# Patient Record
Sex: Female | Born: 1970 | Race: White | Hispanic: No | Marital: Married | Smoking: Never smoker
Health system: Southern US, Community
[De-identification: ages and names within clinical notes are randomized; demographics above are authoritative.]

## PROBLEM LIST (undated history)

## (undated) DIAGNOSIS — K219 Gastro-esophageal reflux disease without esophagitis: Secondary | ICD-10-CM

## (undated) HISTORY — PX: TUBAL LIGATION: SHX77

## (undated) HISTORY — PX: WISDOM TOOTH EXTRACTION: SHX21

## (undated) HISTORY — PX: OTHER SURGICAL HISTORY: SHX169

## (undated) HISTORY — PX: HERNIA REPAIR: SHX51

---

## 2004-10-12 ENCOUNTER — Ambulatory Visit: Payer: Self-pay | Admitting: General Surgery

## 2006-03-22 ENCOUNTER — Ambulatory Visit: Payer: Self-pay

## 2009-05-16 ENCOUNTER — Ambulatory Visit: Payer: Self-pay | Admitting: Family Medicine

## 2018-01-15 ENCOUNTER — Emergency Department: Payer: 59

## 2018-01-15 ENCOUNTER — Emergency Department
Admission: EM | Admit: 2018-01-15 | Discharge: 2018-01-15 | Disposition: A | Discharge status: Home / Self Care | Payer: 59 | Attending: Emergency Medicine | Admitting: Emergency Medicine

## 2018-01-15 ENCOUNTER — Encounter: Payer: Self-pay | Admitting: Emergency Medicine

## 2018-01-15 DIAGNOSIS — R55 Syncope and collapse: Secondary | ICD-10-CM | POA: Diagnosis present

## 2018-01-15 DIAGNOSIS — J111 Influenza due to unidentified influenza virus with other respiratory manifestations: Secondary | ICD-10-CM

## 2018-01-15 DIAGNOSIS — J101 Influenza due to other identified influenza virus with other respiratory manifestations: Secondary | ICD-10-CM | POA: Diagnosis not present

## 2018-01-15 LAB — BASIC METABOLIC PANEL
Anion gap: 8 (ref 5–15)
BUN: 10 mg/dL (ref 6–20)
CALCIUM: 8.5 mg/dL — AB (ref 8.9–10.3)
CO2: 23 mmol/L (ref 22–32)
CREATININE: 0.87 mg/dL (ref 0.44–1.00)
Chloride: 108 mmol/L (ref 98–111)
GFR calc Af Amer: >60 mL/min (ref >60)
GLUCOSE: 115 mg/dL — AB (ref 70–99)
Potassium: 3.5 mmol/L (ref 3.5–5.1)
Sodium: 139 mmol/L (ref 135–145)

## 2018-01-15 LAB — TROPONIN I
Troponin I: <0.03 ng/mL (ref <0.03)
Troponin I: <0.03 ng/mL

## 2018-01-15 LAB — CBC
HEMATOCRIT: 37.2 % (ref 36.0–46.0)
Hemoglobin: 11.9 g/dL — ABNORMAL LOW (ref 12.0–15.0)
MCH: 28.6 pg (ref 26.0–34.0)
MCHC: 32 g/dL (ref 30.0–36.0)
MCV: 89.4 fL (ref 80.0–100.0)
NRBC: 0 % (ref 0.0–0.2)
PLATELETS: 235 10*3/uL (ref 150–400)
RBC: 4.16 MIL/uL (ref 3.87–5.11)
RDW: 13.4 % (ref 11.5–15.5)
WBC: 4.7 10*3/uL (ref 4.0–10.5)

## 2018-01-15 LAB — INFLUENZA PANEL BY PCR (TYPE A & B)
Influenza A By PCR: POSITIVE — AB
Influenza B By PCR: NEGATIVE

## 2018-01-15 LAB — FIBRIN DERIVATIVES D-DIMER (ARMC ONLY): Fibrin derivatives D-dimer (ARMC): 1866.25 ng{FEU}/mL — ABNORMAL HIGH (ref 0.00–499.00)

## 2018-01-15 MED ORDER — ONDANSETRON 4 MG PO TBDP: 4.0000 mg | ORAL_TABLET | Freq: Three times a day (TID) | ORAL | 0 refills | Status: AC | PRN

## 2018-01-15 MED ORDER — KETOROLAC TROMETHAMINE 30 MG/ML IJ SOLN
30.0000 mg | Freq: Once | INTRAMUSCULAR | Status: AC
  Administered 2018-01-15: 30 mg via INTRAVENOUS
  Filled 2018-01-15: qty 1

## 2018-01-15 MED ORDER — GUAIFENESIN-CODEINE 100-10 MG/5ML PO SOLN: 5.0000 mL | Freq: Four times a day (QID) | ORAL | 0 refills | Status: AC | PRN

## 2018-01-15 MED ORDER — SODIUM CHLORIDE 0.9 % IV BOLUS
1000.0000 mL | Freq: Once | INTRAVENOUS | Status: AC
  Administered 2018-01-15: 1000 mL via INTRAVENOUS

## 2018-01-15 MED ORDER — IOHEXOL 350 MG/ML SOLN
75.0000 mL | Freq: Once | INTRAVENOUS | Status: AC | PRN
  Administered 2018-01-15: 75 mL via INTRAVENOUS

## 2018-01-15 MED ORDER — OSELTAMIVIR PHOSPHATE 75 MG PO CAPS: 75.0000 mg | ORAL_CAPSULE | Freq: Two times a day (BID) | ORAL | 0 refills | Status: AC

## 2018-01-15 NOTE — Discharge Instructions (Signed)
You have been seen in the emergency department after a syncopal episode, and have been diagnosed with influenza A.  Please take your Tamiflu as prescribed for the next 5 days.  Please use Tylenol or ibuprofen at home for fever or discomfort, drink plenty of fluids.  Use your cough medication and nausea medication as needed but only as prescribed.  As we discussed your EKG is somewhat atypical, please follow-up with your primary care doctor in the next several days for recheck/reevaluation.  If you develop chest pain or trouble breathing please return to the emergency department for further evaluation.

## 2018-01-15 NOTE — ED Provider Notes (Signed)
[facility] Emergency Department Provider Note  Time seen: 10:38 AM  I have reviewed the triage vital signs and the nursing notes.   HISTORY  Chief Complaint Loss of Consciousness and Influenza    HPI Alisha Lopez is a 48 y.o. female with no significant past medical history presents to the emergency department after syncopal episode.  According to the patient for the past 3 days she has had low-grade fever, cough and congestion.  She states upon awakening this morning she found her fever had risen to 103.  Took DayQuil this morning, went to the walk-in clinic today for evaluation.  While waiting to be seen patient states she became very flushed and warm, went to the bathroom because she felt she was going to pass out to try to put some cold water on her face.  Patient states she opened the door to leave the bathroom and next remembers being surrounded by people on the ground.  No history of syncope in the past.  Patient denies any chest pain at any point.  States she has been coughing with occasional sputum production, states she has been congested.  States today is the first day of high fever.   History reviewed. No pertinent past medical history.  There are no active problems to display for this patient.   Past Surgical History:  Procedure Laterality Date  . HERNIA REPAIR    . tubal ligation      Prior to Admission medications   Not on File    No Known Allergies  No family history on file.  Social History Social History   Tobacco Use  . Smoking status: Not on file  Substance Use Topics  . Alcohol use: Not on file  . Drug use: Not on file    Review of Systems Constitutional: Positive for fever at home. ENT: Positive for congestion x2 to 3 days Cardiovascular: Negative for chest pain. Respiratory: Negative for shortness of breath.  Positive for occasional cough. Gastrointestinal: Negative for abdominal pain, vomiting Musculoskeletal:  Negative for musculoskeletal complaints Skin: Negative for skin complaints  Neurological: Negative for headache All other ROS negative  ____________________________________________   PHYSICAL EXAM:  VITAL SIGNS: ED Triage Vitals  Enc Vitals Group     BP 01/15/18 1031 116/65     Pulse Rate 01/15/18 1031 68     Resp 01/15/18 1031 16     Temp 01/15/18 1031 98.4 F (36.9 C)     Temp Source 01/15/18 1031 Oral     SpO2 01/15/18 1031 95 %     Weight 01/15/18 1032 190 lb (86.2 kg)     Height 01/15/18 1032 5\' 3"  (1.6 m)     Head Circumference --      Peak Flow --      Pain Score 01/15/18 1032 3     Pain Loc --      Pain Edu? --      Excl. in [place]? --     Constitutional: Alert and oriented. Well appearing and in no distress.  Arrived in c-collar. Eyes: Normal exam ENT   Head: Normocephalic and atraumatic.   Mouth/Throat: Mucous membranes are moist. Cardiovascular: Normal rate, regular rhythm. No murmur Respiratory: Normal respiratory effort without tachypnea nor retractions. Breath sounds are clear  Gastrointestinal: Soft and nontender. No distention. Musculoskeletal: Nontender with normal range of motion in all extremities.  No C-spine tenderness.  Full range of motion without any pain or tenderness.  C-spine cleared clinically. Neurologic:  Normal speech and language. No gross focal neurologic deficits  Skin:  Skin is warm, dry and intact.  Psychiatric: Mood and affect are normal.   ____________________________________________    EKG  EKG viewed and interpreted by myself shows normal sinus rhythm at 75 bpm with a narrow QRS, normal axis, normal intervals, patient does have inferolateral ST changes with mild ST depression.  No old EKG for comparison.  ____________________________________________    RADIOLOGY  Chest x-ray is negative  ____________________________________________   INITIAL IMPRESSION / ASSESSMENT AND PLAN / ED COURSE  Pertinent labs & imaging  results that were available during my care of the patient were reviewed by me and considered in my medical decision making (see chart for details).  Patient presents to the emergency department for syncopal episode.  Patient has been coughing with congestion over the past 2 to 3 days found to be febrile to 103 at home.  Patient took DayQuil this morning.  We will dose ibuprofen in the emergency department, check labs, chest x-ray and EKG.  We will check an influenza swab and continue to closely monitor.  We will IV hydrate while awaiting results.  Patient agreeable to plan of care.  Patient does appear to have inferolateral ST changes.  No old EKG for comparison.  Troponin is pending.  We will perform a d-dimer as well.  Denies any chest pain.  No pleuritic pain.  Patient has a normal sinus rhythm in the 60s currently.  Patient's work-up shows influenza A positive.  Patient's d-dimer is elevated to 1800, we will obtain CT angiography of the chest to rule out PE.  Patient agreeable to plan of care.  CT angiography negative for PE.  We will repeat a troponin.  If repeat troponin is negative anticipate discharge home with supportive care.  Repeat troponin is negative.  We will discharge home with Tamiflu, supportive care at home.  I will have the patient follow-up with her primary care doctor regarding her somewhat abnormal EKG however no old EKGs available for comparison.  Given 2 sets of negative troponins I believe the patient is safe for discharge home.  ____________________________________________   FINAL CLINICAL IMPRESSION(S) / ED DIAGNOSES  Upper respiratory infection Syncope   Harvest Dark, MD 01/15/18 1409

## 2018-01-15 NOTE — ED Notes (Signed)
Patient complaining of generalized body aches and coughing since Friday. Was going to urgent care for possible flu. States she felt hot and got up to go to the restroom where she had syncopal episode. Unsure if she hit her head. Patient diaphoretic and complaining of weakness upon arrival. Patient alert and oriented.

## 2018-01-15 NOTE — ED Triage Notes (Signed)
Pt over from [facility] on stretcher with neck brace and back board present. Pt states that she began having flu like sx's Saturday which was what she was going to [facility] for. When pt was waiting in waiting area states that she got hot so went to the restroom, was witnessed passing out heading to the bathroom. A witness states that pt hit side of head on wall. Pt arrived to ED pale. A/O x 4. Remembers episode. Denies pain. No use of blood thinners.

## 2019-05-20 ENCOUNTER — Other Ambulatory Visit: Payer: Self-pay | Admitting: Obstetrics & Gynecology

## 2019-05-20 DIAGNOSIS — Z1231 Encounter for screening mammogram for malignant neoplasm of breast: Secondary | ICD-10-CM

## 2019-05-27 ENCOUNTER — Ambulatory Visit
Admission: RE | Admit: 2019-05-27 | Discharge: 2019-05-27 | Disposition: A | Discharge status: Home / Self Care | Payer: BC Managed Care – PPO | Source: Ambulatory Visit | Attending: Obstetrics & Gynecology | Admitting: Obstetrics & Gynecology

## 2019-05-27 DIAGNOSIS — Z1231 Encounter for screening mammogram for malignant neoplasm of breast: Secondary | ICD-10-CM | POA: Diagnosis present

## 2020-04-01 ENCOUNTER — Other Ambulatory Visit: Payer: Self-pay

## 2020-04-01 ENCOUNTER — Other Ambulatory Visit
Admission: RE | Admit: 2020-04-01 | Discharge: 2020-04-01 | Disposition: A | Discharge status: Home / Self Care | Payer: BC Managed Care – PPO | Source: Ambulatory Visit | Attending: Gastroenterology | Admitting: Gastroenterology

## 2020-04-01 DIAGNOSIS — K640 First degree hemorrhoids: Secondary | ICD-10-CM | POA: Diagnosis not present

## 2020-04-01 DIAGNOSIS — Z01812 Encounter for preprocedural laboratory examination: Secondary | ICD-10-CM | POA: Insufficient documentation

## 2020-04-01 DIAGNOSIS — Z1211 Encounter for screening for malignant neoplasm of colon: Secondary | ICD-10-CM | POA: Diagnosis present

## 2020-04-01 DIAGNOSIS — K219 Gastro-esophageal reflux disease without esophagitis: Secondary | ICD-10-CM | POA: Diagnosis not present

## 2020-04-01 DIAGNOSIS — Z9851 Tubal ligation status: Secondary | ICD-10-CM | POA: Diagnosis not present

## 2020-04-01 DIAGNOSIS — Z20822 Contact with and (suspected) exposure to covid-19: Secondary | ICD-10-CM | POA: Insufficient documentation

## 2020-04-01 DIAGNOSIS — D125 Benign neoplasm of sigmoid colon: Secondary | ICD-10-CM | POA: Diagnosis not present

## 2020-04-01 DIAGNOSIS — Z79899 Other long term (current) drug therapy: Secondary | ICD-10-CM | POA: Diagnosis not present

## 2020-04-01 LAB — SARS CORONAVIRUS 2 (TAT 6-24 HRS): SARS Coronavirus 2: NEGATIVE

## 2020-04-02 ENCOUNTER — Encounter: Payer: Self-pay | Admitting: *Deleted

## 2020-04-03 ENCOUNTER — Encounter: Payer: Self-pay | Admitting: *Deleted

## 2020-04-03 ENCOUNTER — Other Ambulatory Visit: Payer: Self-pay

## 2020-04-03 ENCOUNTER — Ambulatory Visit: Payer: BC Managed Care – PPO | Admitting: Family

## 2020-04-03 ENCOUNTER — Ambulatory Visit
Admission: RE | Admit: 2020-04-03 | Discharge: 2020-04-03 | Disposition: A | Discharge status: Home / Self Care | Payer: BC Managed Care – PPO | Attending: Gastroenterology | Admitting: Gastroenterology

## 2020-04-03 ENCOUNTER — Encounter
Admission: RE | Disposition: A | Discharge status: Home / Self Care | Payer: Self-pay | Source: Home / Self Care | Attending: Gastroenterology

## 2020-04-03 DIAGNOSIS — K640 First degree hemorrhoids: Secondary | ICD-10-CM | POA: Insufficient documentation

## 2020-04-03 DIAGNOSIS — Z9851 Tubal ligation status: Secondary | ICD-10-CM | POA: Insufficient documentation

## 2020-04-03 DIAGNOSIS — Z1211 Encounter for screening for malignant neoplasm of colon: Secondary | ICD-10-CM | POA: Insufficient documentation

## 2020-04-03 DIAGNOSIS — Z79899 Other long term (current) drug therapy: Secondary | ICD-10-CM | POA: Insufficient documentation

## 2020-04-03 DIAGNOSIS — K219 Gastro-esophageal reflux disease without esophagitis: Secondary | ICD-10-CM | POA: Insufficient documentation

## 2020-04-03 DIAGNOSIS — D125 Benign neoplasm of sigmoid colon: Secondary | ICD-10-CM | POA: Insufficient documentation

## 2020-04-03 DIAGNOSIS — Z20822 Contact with and (suspected) exposure to covid-19: Secondary | ICD-10-CM | POA: Insufficient documentation

## 2020-04-03 HISTORY — DX: Gastro-esophageal reflux disease without esophagitis: K21.9

## 2020-04-03 HISTORY — PX: COLONOSCOPY WITH PROPOFOL: SHX5780

## 2020-04-03 LAB — POCT PREGNANCY, URINE: Preg Test, Ur: NEGATIVE

## 2020-04-03 SURGERY — COLONOSCOPY WITH PROPOFOL
Anesthesia: General

## 2020-04-03 MED ORDER — PROPOFOL 500 MG/50ML IV EMUL
INTRAVENOUS | Status: DC | PRN
  Administered 2020-04-03: 150 ug/kg/min via INTRAVENOUS

## 2020-04-03 MED ORDER — SODIUM CHLORIDE 0.9 % IV SOLN: INTRAVENOUS | Status: DC

## 2020-04-03 MED ORDER — PROPOFOL 500 MG/50ML IV EMUL
INTRAVENOUS | Status: AC
  Filled 2020-04-03: qty 50

## 2020-04-03 MED ORDER — PROPOFOL 10 MG/ML IV BOLUS
INTRAVENOUS | Status: DC | PRN
  Administered 2020-04-03: 40 mg via INTRAVENOUS
  Administered 2020-04-03: 50 mg via INTRAVENOUS

## 2020-04-03 NOTE — Interval H&P Note (Signed)
History and Physical Interval Note:  04/03/2020 1:08 PM  Alisha Lopez  has presented today for surgery, with the diagnosis of COLON CANCER SCREENING.  The various methods of treatment have been discussed with the patient and family. After consideration of risks, benefits and other options for treatment, the patient has consented to  Procedure(s): COLONOSCOPY WITH PROPOFOL (N/A) as a surgical intervention.  The patient's history has been reviewed, patient examined, no change in status, stable for surgery.  I have reviewed the patient's chart and labs.  Questions were answered to the patient's satisfaction.     Lesly Rubenstein  Ok to proceed with colonoscopy

## 2020-04-03 NOTE — Op Note (Signed)
[facility] Patient Name: Alisha Lopez Procedure Date: 04/03/2020 1:12 PM MRN: [id number] Account #: [id number] Date of Birth: 1970/12/13 Admit Type: Outpatient Age: 50 Room: [facility] ENDO ROOM 3 Gender: Female Note Status: Finalized Procedure:             Colonoscopy Indications:           Screening for colorectal malignant neoplasm Providers:             Andrey Farmer MD, MD Medicines:             Monitored Anesthesia Care Complications:         No immediate complications. Estimated blood loss:                         Minimal. Procedure:             Pre-Anesthesia Assessment:                        - Prior to the procedure, a History and Physical was                         performed, and patient medications and allergies were                         reviewed. The patient is competent. The risks and                         benefits of the procedure and the sedation options and                         risks were discussed with the patient. All questions                         were answered and informed consent was obtained.                         Patient identification and proposed procedure were                         verified by the physician, the nurse, the anesthetist                         and the technician in the endoscopy suite. Mental                         Status Examination: alert and oriented. Airway                         Examination: normal oropharyngeal airway and neck                         mobility. Respiratory Examination: clear to                         auscultation. CV Examination: normal. Prophylactic                         Antibiotics: The patient does not require prophylactic  antibiotics. Prior Anticoagulants: The patient has                         taken no previous anticoagulant or antiplatelet                         agents. ASA Grade Assessment: I - A normal, healthy                          patient. After reviewing the risks and benefits, the                         patient was deemed in satisfactory condition to                         undergo the procedure. The anesthesia plan was to use                         monitored anesthesia care (MAC). Immediately prior to                         administration of medications, the patient was                         re-assessed for adequacy to receive sedatives. The                         heart rate, respiratory rate, oxygen saturations,                         blood pressure, adequacy of pulmonary ventilation, and                         response to care were monitored throughout the                         procedure. The physical status of the patient was                         re-assessed after the procedure.                        After obtaining informed consent, the colonoscope was                         passed under direct vision. Throughout the procedure,                         the patient's blood pressure, pulse, and oxygen                         saturations were monitored continuously. The                         Colonoscope was introduced through the anus and                         advanced to the the terminal ileum. The colonoscopy  was performed without difficulty. The patient                         tolerated the procedure well. The quality of the bowel                         preparation was excellent. Findings:      The perianal and digital rectal examinations were normal.      The terminal ileum appeared normal.      Two hyperplastic polyps were found in the sigmoid colon. The polyps were       1 mm in size. These polyps were removed with a jumbo cold forceps.       Resection and retrieval were complete. Estimated blood loss was minimal.      Internal hemorrhoids were found during retroflexion. The hemorrhoids       were Grade I (internal hemorrhoids that do not prolapse).      The  exam was otherwise without abnormality on direct and retroflexion       views. Impression:            - The examined portion of the ileum was normal.                        - Two 1 mm polyps in the sigmoid colon, removed with a                         jumbo cold forceps. Resected and retrieved.                        - Internal hemorrhoids.                        - The examination was otherwise normal on direct and                         retroflexion views. Recommendation:        - Discharge patient to home.                        - Resume previous diet.                        - Continue present medications.                        - Await pathology results.                        - Repeat colonoscopy in 10 years for surveillance.                        - Return to referring physician as previously                         scheduled. Procedure Code(s):     --- Professional ---                        [phone], Colonoscopy, flexible; with biopsy, single or  multiple Diagnosis Code(s):     --- Professional ---                        Z12.11, Encounter for screening for malignant neoplasm                         of colon                        K64.0, First degree hemorrhoids                        K63.5, Polyp of colon CPT copyright 2019 [employer]. All rights reserved. The codes documented in this report are preliminary and upon coder review may  be revised to meet current compliance requirements. Andrey Farmer MD, MD 04/03/2020 1:33:07 PM Number of Addenda: 0 Note Initiated On: 04/03/2020 1:12 PM Scope Withdrawal Time: 0 hours 8 minutes 51 seconds  Total Procedure Duration: 0 hours 11 minutes 0 seconds  Estimated Blood Loss:  Estimated blood loss was minimal.      [facility]

## 2020-04-03 NOTE — H&P (Signed)
Outpatient short stay form Pre-procedure 04/03/2020 1:05 PM Alisha Miyamoto MD, MPH  Primary Physician: Dr. Netty Starring  Reason for visit:  Screening Colonoscopy  History of present illness:   50 y/o lady with history of GERD here for screening colonoscopy. No family history of GI malignancies. History of tubal ligation and hernia repair. No blood thinners. No significant symptoms such as chest pain, shortness of breath, GI bleeding.    Current Facility-Administered Medications:  .  0.9 %  sodium chloride infusion, , Intravenous, Continuous, Somnang Mahan, Hilton Cork, MD, Last Rate: 20 mL/hr at 04/03/20 1258, New Bag at 04/03/20 1258  Medications Prior to Admission  Medication Sig Dispense Refill Last Dose  . famotidine (PEPCID) 20 MG tablet Take 20 mg by mouth 2 (two) times daily.   Past Week at Unknown time  . guaiFENesin-codeine 100-10 MG/5ML syrup Take 5 mLs by mouth every 6 (six) hours as needed for cough. 120 mL 0 Past Month at Unknown time  . ondansetron (ZOFRAN ODT) 4 MG disintegrating tablet Take 1 tablet (4 mg total) by mouth every 8 (eight) hours as needed for nausea or vomiting. 20 tablet 0 Past Week at Unknown time     No Known Allergies   Past Medical History:  Diagnosis Date  . GERD (gastroesophageal reflux disease)     Review of systems:  Otherwise negative.    Physical Exam  Gen: Alert, oriented. Appears stated age.  HEENT: PERRLA. Lungs: No respiratory distress CV: RRR Abd: soft, benign, no masses Ext: No edema    Planned procedures: Proceed with colonoscopy. The patient understands the nature of the planned procedure, indications, risks, alternatives and potential complications including but not limited to bleeding, infection, perforation, damage to internal organs and possible oversedation/side effects from anesthesia. The patient agrees and gives consent to proceed.  Please refer to procedure notes for findings, recommendations and patient  disposition/instructions.     Alisha Miyamoto MD, MPH Gastroenterology 04/03/2020  1:05 PM

## 2020-04-03 NOTE — Anesthesia Preprocedure Evaluation (Signed)
Anesthesia Evaluation  Patient identified by MRN, date of birth, ID band Patient awake    Reviewed: Allergy & Precautions, NPO status , Patient's Chart, lab work & pertinent test results  History of Anesthesia Complications Negative for: history of anesthetic complications  Airway Mallampati: II  TM Distance: >3 FB Neck ROM: Full    Dental no notable dental hx.    Pulmonary neg pulmonary ROS, neg sleep apnea, neg COPD,    breath sounds clear to auscultation- rhonchi (-) wheezing      Cardiovascular Exercise Tolerance: Good (-) hypertension(-) CAD, (-) Past MI, (-) Cardiac Stents and (-) CABG  Rhythm:Regular Rate:Normal - Systolic murmurs and - Diastolic murmurs    Neuro/Psych neg Seizures negative neurological ROS  negative psych ROS   GI/Hepatic Neg liver ROS, GERD  ,  Endo/Other  negative endocrine ROSneg diabetes  Renal/GU negative Renal ROS     Musculoskeletal negative musculoskeletal ROS (+)   Abdominal (+) + obese,   Peds  Hematology negative hematology ROS (+)   Anesthesia Other Findings Past Medical History: No date: GERD (gastroesophageal reflux disease)   Reproductive/Obstetrics                             Anesthesia Physical Anesthesia Plan  ASA: II  Anesthesia Plan: General   Post-op Pain Management:    Induction: Intravenous  PONV Risk Score and Plan: 2 and Propofol infusion  Airway Management Planned: Natural Airway  Additional Equipment:   Intra-op Plan:   Post-operative Plan:   Informed Consent: I have reviewed the patients History and Physical, chart, labs and discussed the procedure including the risks, benefits and alternatives for the proposed anesthesia with the patient or authorized representative who has indicated his/her understanding and acceptance.     Dental advisory given  Plan Discussed with: CRNA and Anesthesiologist  Anesthesia Plan  Comments:         Anesthesia Quick Evaluation

## 2020-04-03 NOTE — Transfer of Care (Signed)
Immediate Anesthesia Transfer of Care Note  Patient: Alisha Lopez  Procedure(s) Performed: COLONOSCOPY WITH PROPOFOL (N/A )  Patient Location: PACU  Anesthesia Type:General  Level of Consciousness: drowsy  Airway & Oxygen Therapy: Patient Spontanous Breathing and Patient connected to nasal cannula oxygen  Post-op Assessment: Report given to RN and Post -op Vital signs reviewed and stable  Post vital signs: Reviewed and stable  Last Vitals:  Vitals Value Taken Time  BP    Temp    Pulse    Resp    SpO2      Last Pain:  Vitals:   04/03/20 1246  TempSrc: Temporal  PainSc: 0-No pain         Complications: No complications documented.

## 2020-04-06 LAB — SURGICAL PATHOLOGY

## 2020-04-06 NOTE — Anesthesia Postprocedure Evaluation (Signed)
Anesthesia Post Note  Patient: Kenetha Cozza Common  Procedure(s) Performed: COLONOSCOPY WITH PROPOFOL (N/A )  Patient location during evaluation: PACU Anesthesia Type: General Level of consciousness: awake and alert and oriented Pain management: pain level controlled Vital Signs Assessment: post-procedure vital signs reviewed and stable Respiratory status: spontaneous breathing, nonlabored ventilation and respiratory function stable Cardiovascular status: blood pressure returned to baseline and stable Postop Assessment: no signs of nausea or vomiting Anesthetic complications: no   No complications documented.   Last Vitals:  Vitals:   04/03/20 1340 04/03/20 1350  BP: 116/82 127/74  Pulse: 60 (!) 59  Resp: 18 20  Temp:    SpO2:      Last Pain:  Vitals:   04/04/20 1345  TempSrc:   PainSc: 0-No pain                 Karah Caruthers

## 2020-08-25 IMAGING — CT CT ANGIO CHEST
2 of 6 series · 18 of 46 positions shown · IV contrast (APPLIED)
Comparison: Chest radiographs 01/15/2018

CLINICAL DATA: Cough, syncope, and elevated D-dimer.

EXAM:
CT ANGIOGRAPHY CHEST WITH CONTRAST
TECHNIQUE: Multidetector CT imaging of the chest was performed using the
standard protocol during bolus administration of intravenous
contrast. Multiplanar CT image reconstructions and MIPs were
obtained to evaluate the vascular anatomy.
CONTRAST:  75mL OMNIPAQUE IOHEXOL 350 MG/ML SOLN

[Series 5: thins · axial · 0.63mm/px · z∈[+530,+803]mm · 16 of 299 slices shown]
[im 13/299  lung]
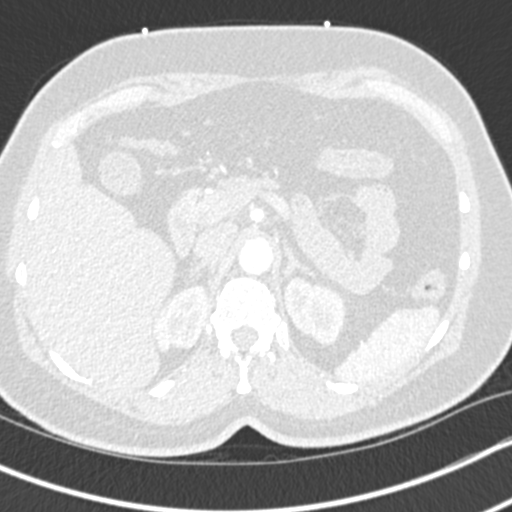
[im 39/299  soft-tissue]
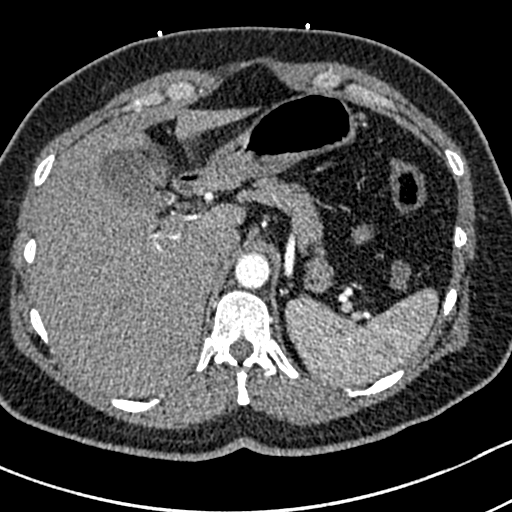
[im 52/299  lung]
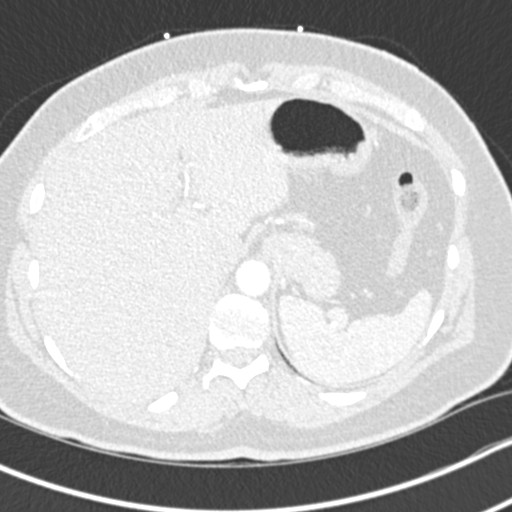
[im 65/299  soft-tissue]
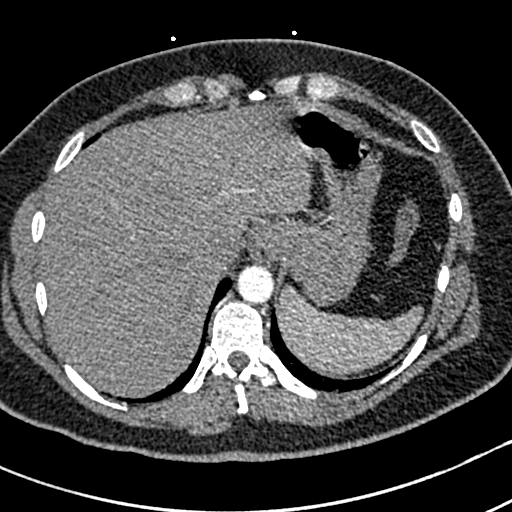
[im 91/299  lung]
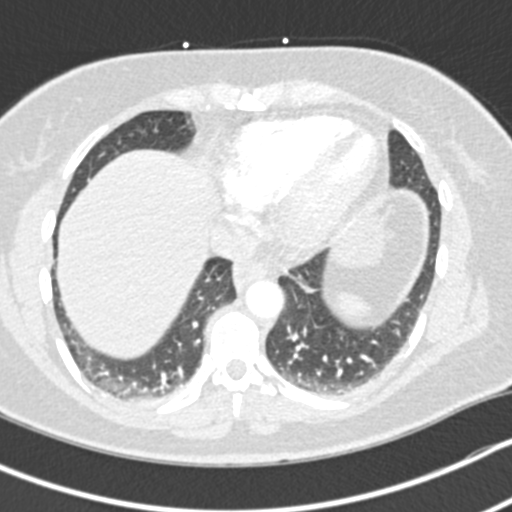
[im 104/299  soft-tissue]
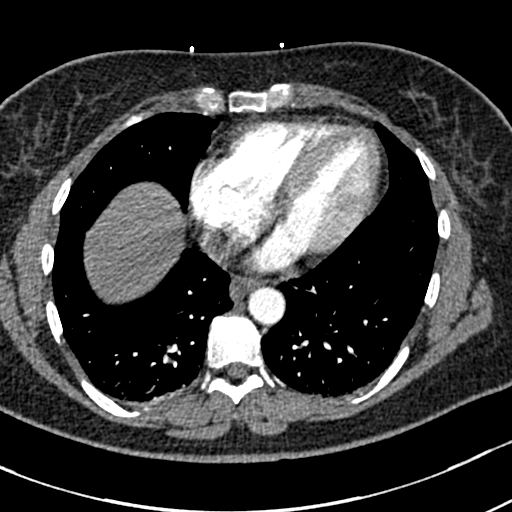
[im 117/299  lung]
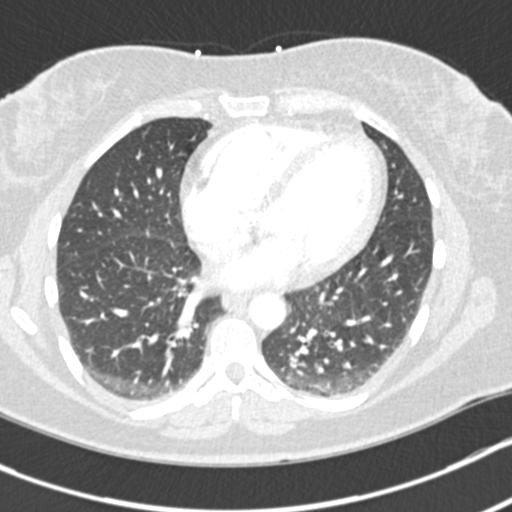
[im 143/299  soft-tissue]
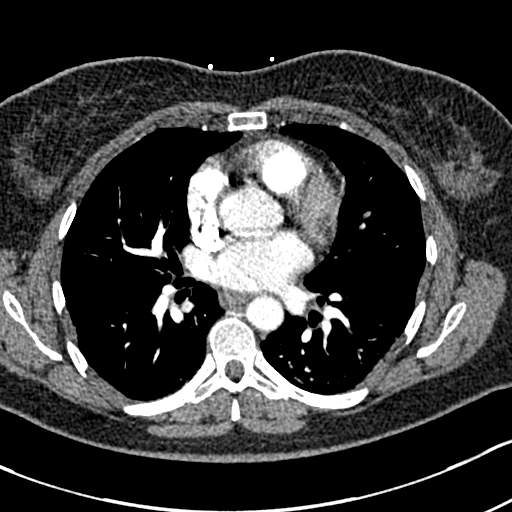
[im 156/299  lung]
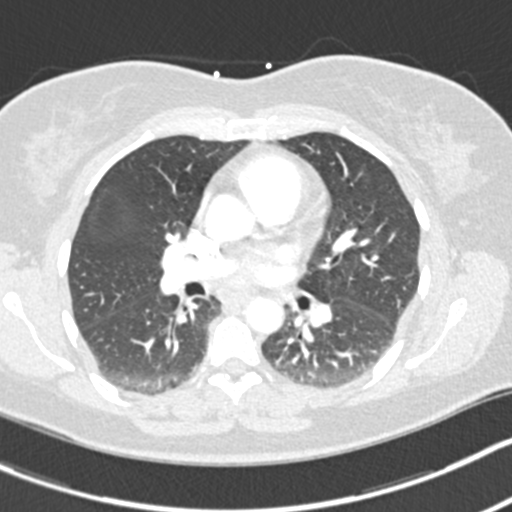
[im 182/299  soft-tissue]
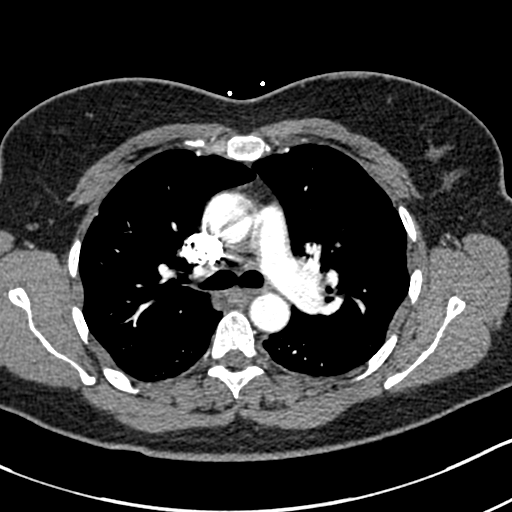
[im 195/299  lung]
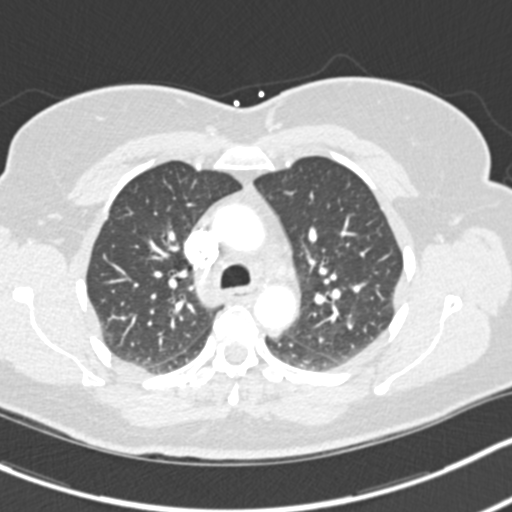
[im 208/299  soft-tissue]
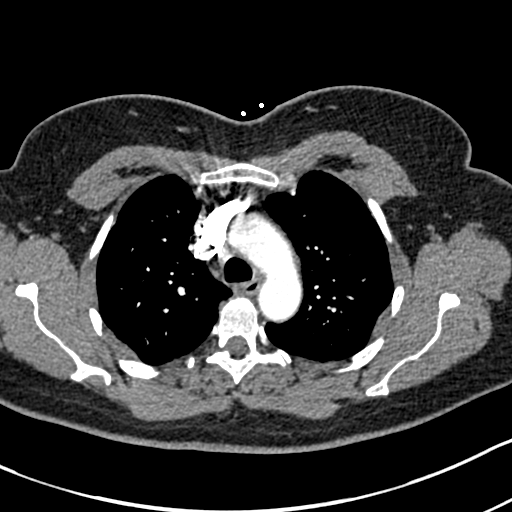
[im 234/299  lung]
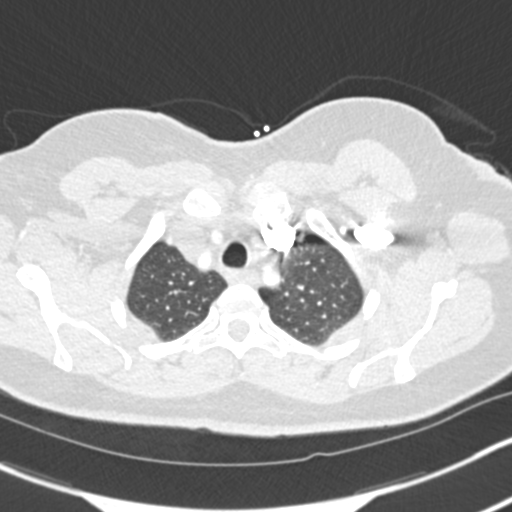
[im 247/299  soft-tissue]
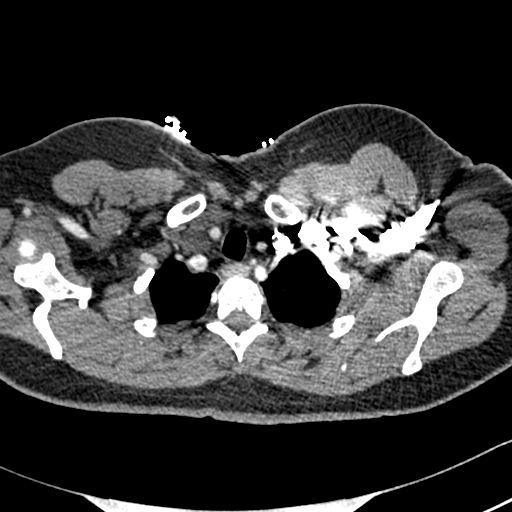
[im 260/299  lung]
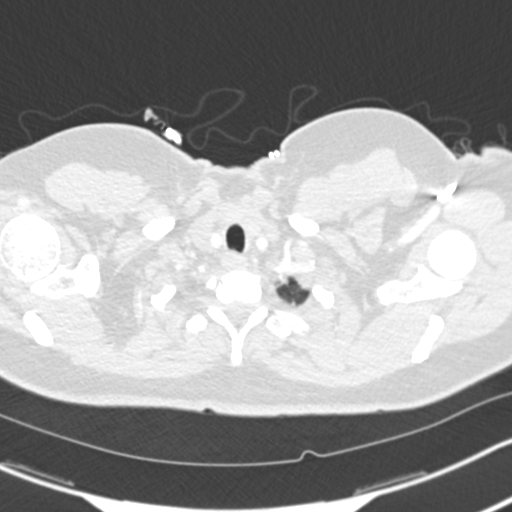
[im 286/299  soft-tissue]
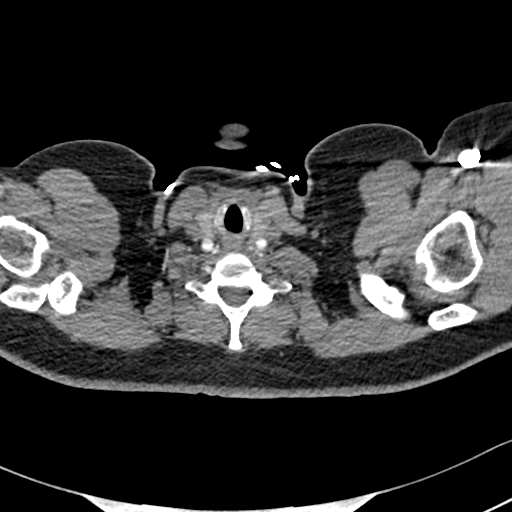

[Series 7: coronal mpr · coronal · 0.59mm/px · 2 of 106 slices shown]
[im 36/106  soft-tissue]
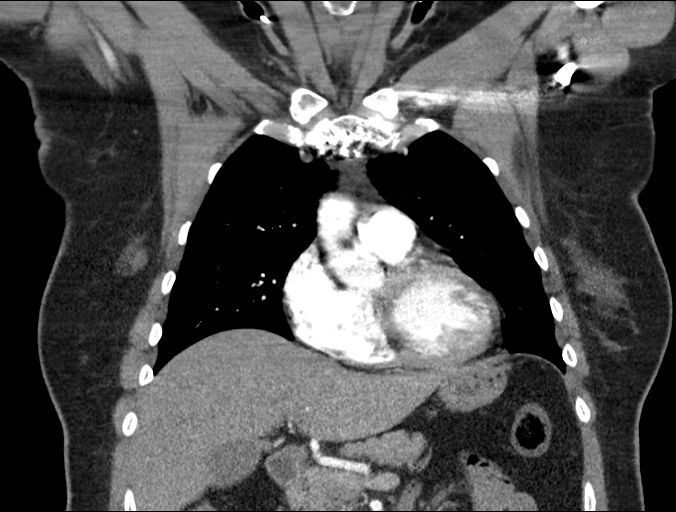
[im 71/106  soft-tissue]
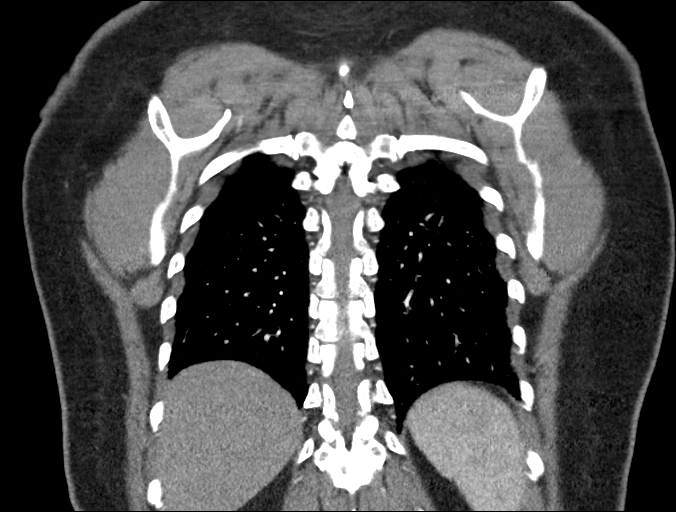

[18 of 46 positions shown; findings below may reference images not displayed]

FINDINGS: Cardiovascular: Pulmonary arterial opacification is adequate to the
segmental level without evidence of emboli. There is no evidence of
thoracic aortic dissection or aneurysm. The heart is normal in size.
There is no pericardial effusion.

Mediastinum/Nodes: No enlarged axillary, mediastinal, or hilar lymph
nodes. Small sliding hiatal hernia. Unremarkable thyroid.

Lungs/Pleura: No pleural effusion or pneumothorax. Mild respiratory
motion with mild dependent atelectasis in both lower lobes. No mass.

Upper Abdomen: Diffusely decreased attenuation of the liver which
may reflect steatosis.

Musculoskeletal: No acute osseous abnormality or suspicious osseous
lesion.

Review of the MIP images confirms the above findings.
IMPRESSION: 1. No evidence of pulmonary emboli or other acute abnormality in the
chest.
2. Small hiatal hernia.

## 2021-09-17 ENCOUNTER — Other Ambulatory Visit: Payer: Self-pay | Admitting: Family Medicine

## 2021-09-17 DIAGNOSIS — Z1231 Encounter for screening mammogram for malignant neoplasm of breast: Secondary | ICD-10-CM

## 2021-10-19 ENCOUNTER — Ambulatory Visit
Admission: RE | Admit: 2021-10-19 | Discharge: 2021-10-19 | Disposition: A | Discharge status: Home / Self Care | Payer: BC Managed Care – PPO | Source: Ambulatory Visit | Attending: Family Medicine | Admitting: Family Medicine

## 2021-10-19 DIAGNOSIS — Z1231 Encounter for screening mammogram for malignant neoplasm of breast: Secondary | ICD-10-CM | POA: Diagnosis present

## 2023-09-11 ENCOUNTER — Encounter: Payer: Self-pay | Admitting: Family Medicine

## 2023-10-09 ENCOUNTER — Other Ambulatory Visit: Payer: Self-pay | Admitting: Family Medicine

## 2023-10-09 DIAGNOSIS — Z1231 Encounter for screening mammogram for malignant neoplasm of breast: Secondary | ICD-10-CM

## 2023-11-06 ENCOUNTER — Ambulatory Visit
Admission: RE | Admit: 2023-11-06 | Discharge: 2023-11-06 | Disposition: A | Discharge status: Home / Self Care | Source: Ambulatory Visit | Attending: Family Medicine | Admitting: Family Medicine

## 2023-11-06 DIAGNOSIS — Z1231 Encounter for screening mammogram for malignant neoplasm of breast: Secondary | ICD-10-CM | POA: Insufficient documentation
